# Patient Record
Sex: Female | Born: 1960 | Race: Asian | Hispanic: No | Marital: Married | Smoking: Never smoker
Health system: Southern US, Community
[De-identification: ages and names within clinical notes are randomized; demographics above are authoritative.]

## PROBLEM LIST (undated history)

## (undated) HISTORY — PX: NECK SURGERY: SHX720

---

## 2010-12-28 ENCOUNTER — Emergency Department: Payer: Self-pay | Admitting: Emergency Medicine

## 2011-03-13 ENCOUNTER — Ambulatory Visit: Payer: Self-pay | Admitting: Internal Medicine

## 2011-03-13 LAB — CBC WITH DIFFERENTIAL/PLATELET
Basophil #: 0 10*3/uL (ref 0.0–0.1)
Basophil %: 0.2 %
Eosinophil #: 0 10*3/uL (ref 0.0–0.7)
Eosinophil %: 0 %
HGB: 12.2 g/dL (ref 12.0–16.0)
Lymphocyte %: 14.6 %
MCHC: 33.7 g/dL (ref 32.0–36.0)
Neutrophil %: 74.9 %
Platelet: 215 10*3/uL (ref 150–440)
RBC: 4.02 10*6/uL (ref 3.80–5.20)
WBC: 8.4 10*3/uL (ref 3.6–11.0)

## 2016-06-14 ENCOUNTER — Inpatient Hospital Stay: Payer: BLUE CROSS/BLUE SHIELD | Attending: Oncology | Admitting: Oncology

## 2016-06-14 ENCOUNTER — Encounter: Payer: Self-pay | Admitting: Oncology

## 2016-06-14 ENCOUNTER — Inpatient Hospital Stay: Payer: BLUE CROSS/BLUE SHIELD

## 2016-06-14 ENCOUNTER — Encounter (INDEPENDENT_AMBULATORY_CARE_PROVIDER_SITE_OTHER): Payer: Self-pay

## 2016-06-14 VITALS — BP 121/85 | HR 93 | Temp 98.8°F | Resp 18 | Ht 61.42 in | Wt 105.7 lb

## 2016-06-14 DIAGNOSIS — Z79899 Other long term (current) drug therapy: Secondary | ICD-10-CM | POA: Insufficient documentation

## 2016-06-14 DIAGNOSIS — D709 Neutropenia, unspecified: Secondary | ICD-10-CM

## 2016-06-14 LAB — CBC WITH DIFFERENTIAL/PLATELET
BASOS PCT: 1 %
Basophils Absolute: 0 10*3/uL (ref 0–0.1)
Eosinophils Absolute: 0 10*3/uL (ref 0–0.7)
Eosinophils Relative: 0 %
HEMATOCRIT: 39.3 % (ref 35.0–47.0)
Hemoglobin: 13.4 g/dL (ref 12.0–16.0)
Lymphocytes Relative: 33 %
Lymphs Abs: 1.5 10*3/uL (ref 1.0–3.6)
MCH: 29.9 pg (ref 26.0–34.0)
MCHC: 34.1 g/dL (ref 32.0–36.0)
MCV: 87.7 fL (ref 80.0–100.0)
MONO ABS: 0.4 10*3/uL (ref 0.2–0.9)
MONOS PCT: 9 %
NEUTROS ABS: 2.6 10*3/uL (ref 1.4–6.5)
Neutrophils Relative %: 57 %
Platelets: 251 10*3/uL (ref 150–440)
RBC: 4.48 MIL/uL (ref 3.80–5.20)
RDW: 12.5 % (ref 11.5–14.5)
WBC: 4.5 10*3/uL (ref 3.6–11.0)

## 2016-06-14 LAB — FOLATE: Folate: 28 ng/mL (ref >5.9)

## 2016-06-14 LAB — VITAMIN B12: Vitamin B-12: 491 pg/mL (ref 180–914)

## 2016-06-14 NOTE — Progress Notes (Signed)
Hematology/Oncology Consult note [facility] [phone] [phone]  Patient Care Team: Patient, No Pcp Per as PCP - General (General Practice)   Name of the patient: Pam Contreras  [id number]  09-Jan-1961    Reason for referral- leukopenia   Referring physician- Dr. Netty Starring  Date of visit: 06/14/16   History of presenting illness- Patient is a 55 year old female who was been referred to [place] for evaluation and management of leukopenia. Her most recent CBC from 05/25/2016 revealed a white count of 3.5, H&H of 12.6/37.6 and a platelet count of 220. Differential mainly showed neutral pia with relative lymphocytosis. Absolute neutrophil count was 1.13 and absolute lymphocyte count was normal at 1.87. On reviewing her prior CBCs patient has always had mild neutropenia and her ANC is between 890- 1250  She feels well. Denies any complaints. Denies recurrent infections, unintentional weight loss or night sweats. She has been taking some herbal medication from her home country for several years now. Denies other OTC meds   ECOG PS- 0  Pain scale- 0   Review of systems- Review of Systems  Constitutional: Negative for chills, fever, malaise/fatigue and weight loss.  HENT: Negative for congestion, ear discharge and nosebleeds.   Eyes: Negative for blurred vision.  Respiratory: Negative for cough, hemoptysis, sputum production, shortness of breath and wheezing.   Cardiovascular: Negative for chest pain, palpitations, orthopnea and claudication.  Gastrointestinal: Negative for abdominal pain, blood in stool, constipation, diarrhea, heartburn, melena, nausea and vomiting.  Genitourinary: Negative for dysuria, flank pain, frequency, hematuria and urgency.  Musculoskeletal: Negative for back pain, joint pain and myalgias.  Skin: Negative for rash.  Neurological: Negative for dizziness, tingling, focal weakness, seizures, weakness and headaches.    Endo/Heme/Allergies: Does not bruise/bleed easily.  Psychiatric/Behavioral: Negative for depression and suicidal ideas. The patient does not have insomnia.     No Known Allergies  There are no active problems to display for this patient.    History reviewed. No pertinent past medical history.   History reviewed. No pertinent surgical history.  Social History   Social History  . Marital status: Married    Spouse name: N/A  . Number of children: N/A  . Years of education: N/A   Occupational History  . Not on file.   Social History Main Topics  . Smoking status: Not on file  . Smokeless tobacco: Not on file  . Alcohol use Not on file  . Drug use: Unknown  . Sexual activity: Not on file   Other Topics Concern  . Not on file   Social History Narrative  . No narrative on file     Family History  Problem Relation Age of Onset  . Coronary artery disease Mother   . COPD Father      Current Outpatient Prescriptions:  .  calcium citrate-vitamin D (CITRACAL+D) 315-200 MG-UNIT tablet, Take 1 tablet by mouth daily., Disp: , Rfl:  .  Multiple Vitamin (MULTI-VITAMINS) TABS, Take by mouth., Disp: , Rfl:  .  cyclobenzaprine (FLEXERIL) 10 MG tablet, TAKE 1 TABLET (10 MG TOTAL) BY MOUTH NIGHTLY AS NEEDED FOR MUSCLE SPASMS FOR UP TO 10 DAYS., Disp: , Rfl: 0 .  meloxicam (MOBIC) 15 MG tablet, TAKE 1 TABLET (15 MG TOTAL) BY MOUTH DAILY WITH BREAKFAST., Disp: , Rfl: 0   Physical exam:  Vitals:   06/14/16 1407  BP: 121/85  Pulse: 93  Resp: 18  Temp: 98.8 F (37.1 C)  TempSrc: Tympanic  Weight: 105 lb  11.2 oz (47.9 kg)  Height: 5' 1.42" (1.56 m)   Physical Exam  Constitutional: She is oriented to person, place, and time and well-developed, well-nourished, and in no distress.  HENT:  Head: Normocephalic and atraumatic.  Eyes: EOM are normal. Pupils are equal, round, and reactive to light.  Neck: Normal range of motion.  Cardiovascular: Normal rate, regular rhythm and  normal heart sounds.   Pulmonary/Chest: Effort normal and breath sounds normal.  Abdominal: Soft. Bowel sounds are normal.  Neurological: She is alert and oriented to person, place, and time.  Skin: Skin is warm and dry.       No flowsheet data found. CBC Latest Ref Rng & Units 03/13/2011  WBC 3.6 - 11.0 x10 3/mm 3 8.4  Hemoglobin 12.0 - 16.0 g/dL 12.2  Hematocrit 35.0 - 47.0 % 36.2  Platelets 150 - 440 x10 3/mm 3 215     Assessment and plan- Patient is a 56 y.o. 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 She does not have any other cytopenias. This is likely benign. She is not on medications that can cause neutropenia. Today I will check cbc with diff, pathology review of her smear, B12, folate and copper levels. I will see her back in 2 weeks time to discuss her results. Given that she has mild isolate neutropenia in the absence of other cytopenias, she does not require a bone marrow biopsy at this time  It is unclear what herbal medication she is using. I will have her stop taking that and recheck cbc in 3 months  Thank you for this kind referral and the opportunity to participate in the care of this patient   Visit Diagnosis 1. Neutropenia, unspecified type [facility])     Dr. Randa Evens, MD, MPH [facility] Pager- [phone] 06/14/2016

## 2016-06-14 NOTE — Progress Notes (Signed)
Here for new pt evaluation.  

## 2016-06-15 LAB — PATHOLOGIST SMEAR REVIEW

## 2016-06-27 NOTE — Progress Notes (Signed)
Hematology/Oncology Consult note [facility]  [phone] [phone]  Patient Care Team: Patient, No Pcp Per as PCP - General (General Practice)   Name of the patient: Pam Contreras  [id number]  03/15/1960   Date of visit: 06/27/16  Diagnosis- leukopenia/ neutropenia likley benign versus herbal drug induced  Chief complaint/ Reason for visit- discuss results of bloodwork  Heme/Onc history: Patient is a 56 year old female who was been referred to us for evaluation and management of leukopenia. Her most recent CBC from 05/25/2016 revealed a white count of 3.5, H&H of 12.6/37.6 and a platelet count of 220. Differential mainly showed neutral pia with relative lymphocytosis. Absolute neutrophil count was 1.13 and absolute lymphocyte count was normal at 1.87. On reviewing her prior CBCs patient has always had mild neutropenia and her ANC is between 890- 1250  She feels well. Denies any complaints. Denies recurrent infections, unintentional weight loss or night sweats. She has been taking some herbal medication from her home [place] for several years now. Denies other OTC meds   bloodwork from 06/14/16 was as follows: cbc was normal with wbc of 4.5 with normal differential. Pathology review of smear was unremarkable. b12 and folate was WNL   Interval history- doing well. Denies any complaints   Review of systems- Review of Systems  Constitutional: Negative for chills, fever, malaise/fatigue and weight loss.  HENT: Negative for congestion, ear discharge and nosebleeds.   Eyes: Negative for blurred vision.  Respiratory: Negative for cough, hemoptysis, sputum production, shortness of breath and wheezing.   Cardiovascular: Negative for chest pain, palpitations, orthopnea and claudication.  Gastrointestinal: Negative for abdominal pain, blood in stool, constipation, diarrhea, heartburn, melena, nausea and vomiting.  Genitourinary: Negative  for dysuria, flank pain, frequency, hematuria and urgency.  Musculoskeletal: Negative for back pain, joint pain and myalgias.  Skin: Negative for rash.  Neurological: Negative for dizziness, tingling, focal weakness, seizures, weakness and headaches.  Endo/Heme/Allergies: Does not bruise/bleed easily.  Psychiatric/Behavioral: Negative for depression and suicidal ideas. The patient does not have insomnia.      Current treatment- observation  No Known Allergies   No past medical history on file.   No past surgical history on file.  Social History   Social History  . Marital status: Married    Spouse name: N/A  . Number of children: N/A  . Years of education: N/A   Occupational History  . Not on file.   Social History Main Topics  . Smoking status: Never Smoker  . Smokeless tobacco: Never Used  . Alcohol use No  . Drug use: No  . Sexual activity: No   Other Topics Concern  . Not on file   Social History Narrative  . No narrative on file    Family History  Problem Relation Age of Onset  . Coronary artery disease Mother   . COPD Father      Current Outpatient Prescriptions:  .  calcium citrate-vitamin D (CITRACAL+D) 315-200 MG-UNIT tablet, Take 1 tablet by mouth daily., Disp: , Rfl:  .  cyclobenzaprine (FLEXERIL) 10 MG tablet, TAKE 1 TABLET (10 MG TOTAL) BY MOUTH NIGHTLY AS NEEDED FOR MUSCLE SPASMS FOR UP TO 10 DAYS., Disp: , Rfl: 0 .  meloxicam (MOBIC) 15 MG tablet, TAKE 1 TABLET (15 MG TOTAL) BY MOUTH DAILY WITH BREAKFAST., Disp: , Rfl: 0 .  Multiple Vitamin (MULTI-VITAMINS) TABS, Take by mouth., Disp: , Rfl:   Physical exam:  Vitals:   06/28/16 1127  BP: Marland Kitchen(!)  146/79  Pulse: 94  Resp: 18  Temp: 97.8 F (36.6 C)  TempSrc: Tympanic  Weight: 105 lb 14.4 oz (48 kg)   Physical Exam  Constitutional: She is oriented to person, place, and time and well-developed, well-nourished, and in no distress.  HENT:  Head: Normocephalic and atraumatic.  Eyes: EOM are  normal. Pupils are equal, round, and reactive to light.  Neck: Normal range of motion.  Cardiovascular: Normal rate, regular rhythm and normal heart sounds.   Pulmonary/Chest: Effort normal and breath sounds normal.  Abdominal: Soft. Bowel sounds are normal.  Neurological: She is alert and oriented to person, place, and time.  Skin: Skin is warm and dry.     No flowsheet data found. CBC Latest Ref Rng & Units 06/14/2016  WBC 3.6 - 11.0 K/uL 4.5  Hemoglobin 12.0 - 16.0 g/dL 52.8  Hematocrit 41.3 - 47.0 % 39.3  Platelets 150 - 440 K/uL 251     Assessment and plan- Patient is a 56 y.o. female referred for neutropenia likely benign  I discussed the results of blood work as above. Here repeat cbc was normal with normal wbc and no neutropenia. She has had mild leukopenia in the past and it waxes and wanes. No other cytopenias. This is likely benign. I will see her back in 6 months with repeat cbc and diff. If counts are normal she can continue to f/u with her pcp and can be referred to [place] if she has worsening leukopenia or other cytopenias   Visit Diagnosis 1. Neutropenia, unspecified type [facility])      Dr. Owens Shark, MD, MPH [facility] Pager- [phone] 06/27/2016 11:47 AM

## 2016-06-28 ENCOUNTER — Inpatient Hospital Stay (HOSPITAL_BASED_OUTPATIENT_CLINIC_OR_DEPARTMENT_OTHER): Payer: BLUE CROSS/BLUE SHIELD | Admitting: Oncology

## 2016-06-28 ENCOUNTER — Encounter: Payer: Self-pay | Admitting: Oncology

## 2016-06-28 VITALS — BP 146/79 | HR 94 | Temp 97.8°F | Resp 18 | Wt 105.9 lb

## 2016-06-28 DIAGNOSIS — D709 Neutropenia, unspecified: Secondary | ICD-10-CM

## 2016-06-28 NOTE — Progress Notes (Signed)
Here for follow up.stated she feels"great"

## 2016-07-25 ENCOUNTER — Encounter: Payer: Self-pay | Admitting: Family Medicine

## 2016-08-11 ENCOUNTER — Emergency Department: Payer: BLUE CROSS/BLUE SHIELD

## 2016-08-11 ENCOUNTER — Emergency Department
Admission: EM | Admit: 2016-08-11 | Discharge: 2016-08-11 | Disposition: A | Discharge status: Home / Self Care | Payer: BLUE CROSS/BLUE SHIELD | Attending: Emergency Medicine | Admitting: Emergency Medicine

## 2016-08-11 DIAGNOSIS — R112 Nausea with vomiting, unspecified: Secondary | ICD-10-CM | POA: Insufficient documentation

## 2016-08-11 DIAGNOSIS — R42 Dizziness and giddiness: Secondary | ICD-10-CM | POA: Insufficient documentation

## 2016-08-11 DIAGNOSIS — Z79899 Other long term (current) drug therapy: Secondary | ICD-10-CM | POA: Insufficient documentation

## 2016-08-11 LAB — CBC
HCT: 39.5 % (ref 35.0–47.0)
HEMOGLOBIN: 13.6 g/dL (ref 12.0–16.0)
MCH: 30.4 pg (ref 26.0–34.0)
MCHC: 34.4 g/dL (ref 32.0–36.0)
MCV: 88.3 fL (ref 80.0–100.0)
Platelets: 271 10*3/uL (ref 150–440)
RBC: 4.47 MIL/uL (ref 3.80–5.20)
RDW: 12.9 % (ref 11.5–14.5)
WBC: 6.5 10*3/uL (ref 3.6–11.0)

## 2016-08-11 LAB — TROPONIN I

## 2016-08-11 LAB — COMPREHENSIVE METABOLIC PANEL
ALK PHOS: 73 U/L (ref 38–126)
ALT: 26 U/L (ref 14–54)
ANION GAP: 13 (ref 5–15)
AST: 30 U/L (ref 15–41)
Albumin: 4.8 g/dL (ref 3.5–5.0)
BILIRUBIN TOTAL: 0.7 mg/dL (ref 0.3–1.2)
BUN: 16 mg/dL (ref 6–20)
CALCIUM: 9.9 mg/dL (ref 8.9–10.3)
CO2: 21 mmol/L — AB (ref 22–32)
Chloride: 103 mmol/L (ref 101–111)
Creatinine, Ser: 0.8 mg/dL (ref 0.44–1.00)
GFR calc non Af Amer: >60 mL/min (ref >60)
Glucose, Bld: 165 mg/dL — ABNORMAL HIGH (ref 65–99)
Potassium: 3.3 mmol/L — ABNORMAL LOW (ref 3.5–5.1)
SODIUM: 137 mmol/L (ref 135–145)
TOTAL PROTEIN: 8.3 g/dL — AB (ref 6.5–8.1)

## 2016-08-11 LAB — LIPASE, BLOOD: Lipase: 32 U/L (ref 11–51)

## 2016-08-11 MED ORDER — ONDANSETRON HCL 4 MG/2ML IJ SOLN
INTRAMUSCULAR | Status: AC
  Administered 2016-08-11: 2 mg
  Filled 2016-08-11: qty 2

## 2016-08-11 MED ORDER — ONDANSETRON 4 MG PO TBDP: 4.0000 mg | ORAL_TABLET | Freq: Three times a day (TID) | ORAL | 0 refills | Status: AC | PRN

## 2016-08-11 MED ORDER — SODIUM CHLORIDE 0.9 % IV BOLUS (SEPSIS)
1000.0000 mL | Freq: Once | INTRAVENOUS | Status: AC
  Administered 2016-08-11: 1000 mL via INTRAVENOUS

## 2016-08-11 MED ORDER — GI COCKTAIL ~~LOC~~
30.0000 mL | Freq: Once | ORAL | Status: AC
  Administered 2016-08-11: 30 mL via ORAL
  Filled 2016-08-11: qty 30

## 2016-08-11 NOTE — ED Triage Notes (Signed)
Pt comes via [facility] from work with c/o dizziness and feeling cold. Pt is alert and oriented. Upon arrival to ED pt vomited. Pt states little chest pain and nausea. VS stable, NSR EKG.

## 2016-08-11 NOTE — Discharge Instructions (Signed)
Please follow up with the acute care clinic or your primary care physician

## 2016-08-11 NOTE — ED Provider Notes (Signed)
[facility] Emergency Department Provider Note   ____________________________________________   First MD Initiated Contact with Patient 08/11/16 0002     (approximate)  I have reviewed the triage vital signs and the nursing notes.   HISTORY  Chief Complaint Dizziness and Emesis    HPI Pam Contreras is a 56 y.o. female who comes into the hospital today with dizziness and vomiting. The patient reports that she was at work and she went into the bathroom. She reports that there was a weird smell that made her sick. She started feeling dizzy and felt that she was going to vomit. The patient vomited when she arrived to the emergency department. She reports that she had a few chills when she was at work and took 2 Advil. She states that this was around 9:30. The patient was trying to last until the end of her shift. She reports that after taking the Advil she went to the bathroom and felt nauseous and dizzy. She reports that every time she goes to the bathroom is a strong smell that is like a cleaning product. The patient denies any abdominal pain just felt very dizzy. She reports this lightheaded with the room was not spinning. She reports that her arm felt a little numb and she did develop some chest pain as well on her way here. The patient rates her pain a 4-5 out of 10 in intensity. According to EMS the patient was hyperventilating and she did have some cramping in her hands. The patient reports she has some chest pain and shortness of breath at Center mid chest that just started. The patient came into the hospital today for evaluation. She denies any sick contacts and denies any other complaints at this time.   History reviewed. No pertinent past medical history.  There are no active problems to display for this patient.   History reviewed. No pertinent surgical history.  Prior to Admission medications   Medication Sig Start Date End Date Taking?  Authorizing Provider  Multiple Vitamin (MULTI-VITAMINS) TABS Take by mouth.   Yes [provider]  calcium citrate-vitamin D (CITRACAL+D) 315-200 MG-UNIT tablet Take 1 tablet by mouth daily.    [provider]  ondansetron (ZOFRAN ODT) 4 MG disintegrating tablet Take 1 tablet (4 mg total) by mouth every 8 (eight) hours as needed for nausea or vomiting. 08/11/16   Rebecka Apley, MD    Allergies Patient has no known allergies.  Family History  Problem Relation Age of Onset  . Coronary artery disease Mother   . COPD Father     Social History Social History  Substance Use Topics  . Smoking status: Never Smoker  . Smokeless tobacco: Never Used  . Alcohol use No    Review of Systems  Constitutional: chills Eyes: No visual changes. ENT: No sore throat. Cardiovascular: chest pain. Respiratory:  shortness of breath. Gastrointestinal: No abdominal pain.  No nausea, no vomiting.  No diarrhea.  No constipation. Genitourinary: Negative for dysuria. Musculoskeletal: Negative for back pain. Skin: Negative for rash. Neurological: Dizziness   ____________________________________________   PHYSICAL EXAM:  VITAL SIGNS: ED Triage Vitals  Enc Vitals Group     BP 08/11/16 0012 133/81     Pulse Rate 08/11/16 0012 98     Resp 08/11/16 0012 (!) 22     Temp 08/11/16 0012 (!) 97.5 F (36.4 C)     Temp Source 08/11/16 0012 Oral     SpO2 08/11/16 0012 100 %  Weight 08/11/16 0013 108 lb (49 kg)     Height 08/11/16 0013 5' (1.524 m)     Head Circumference --      Peak Flow --      Pain Score 08/11/16 0011 4     Pain Loc --      Pain Edu? --      Excl. in GC? --     Constitutional: Alert and oriented. Ill appearing and in moderate distress. Eyes: Conjunctivae are normal. PERRL. EOMI. Head: Atraumatic. Nose: No congestion/rhinnorhea. Mouth/Throat: Mucous membranes are moist.  Oropharynx non-erythematous. Cardiovascular: Normal rate, regular rhythm. Grossly  normal heart sounds.  Good peripheral circulation. Respiratory: Normal respiratory effort.  No retractions. Lungs CTAB. Gastrointestinal: Soft and nontender. No distention. Positive bowel sounds Musculoskeletal: No lower extremity tenderness nor edema.   Neurologic:  Normal speech and language. Cranial nerves II through XII are grossly intact with no focal motor neuro deficits. Skin:  Skin is warm, dry and intact.Marland Kitchen. Psychiatric: Mood and affect are normal.   ____________________________________________   LABS (all labs ordered are listed, but only abnormal results are displayed)  Labs Reviewed  COMPREHENSIVE METABOLIC PANEL - Abnormal; Notable for the following:       Result Value   Potassium 3.3 (*)    CO2 21 (*)    Glucose, Bld 165 (*)    Total Protein 8.3 (*)    All other components within normal limits  CBC  LIPASE, BLOOD  TROPONIN I  TROPONIN I   ____________________________________________  EKG  ED ECG REPORT I, Rebecka ApleyWebster,  Avia Merkley P, the attending physician, personally viewed and interpreted this ECG.   Date: 08/11/2016  EKG Time: 0013  Rate: 96  Rhythm: normal sinus rhythm  Axis: normal  Intervals:none  ST&T Change: none  ____________________________________________  RADIOLOGY  Dg Chest 2 View  Result Date: 08/11/2016 CLINICAL DATA:  56 year old female with shortness of breath and dizziness. EXAM: CHEST  2 VIEW COMPARISON:  None. FINDINGS: The lungs are clear. There is no pleural effusion or pneumothorax. A 13 mm nodular density over the left lung base most consistent with nipple shadow. Repeat radiograph with nipple markers may provide better evaluation. The cardiac silhouette is within normal limits with no acute osseous pathology identified. IMPRESSION: No acute cardiopulmonary process. A 13 mm rounded density over the left lung base, likely nipple shadow. Electronically Signed   By: Elgie CollardArash  Radparvar M.D.   On: 08/11/2016 01:08   Ct Head Wo Contrast  Result  Date: 08/11/2016 CLINICAL DATA:  Dizziness and vomiting. EXAM: CT HEAD WITHOUT CONTRAST TECHNIQUE: Contiguous axial images were obtained from the base of the skull through the vertex without intravenous contrast. COMPARISON:  None. FINDINGS: BRAIN: No intraparenchymal hemorrhage, mass effect nor midline shift. The ventricles and sulci are normal. No acute large vascular territory infarcts. No abnormal extra-axial fluid collections. Basal cisterns are patent. VASCULAR: Unremarkable. SKULL/SOFT TISSUES: No skull fracture. No significant soft tissue swelling. ORBITS/SINUSES: The included ocular globes and orbital contents are normal.The mastoid aircells and included paranasal sinuses are well-aerated. OTHER: None. IMPRESSION: Normal noncontrast CT HEAD. Electronically Signed   By: Awilda Metroourtnay  Bloomer M.D.   On: 08/11/2016 01:35    ____________________________________________   PROCEDURES  Procedure(s) performed: None  Procedures  Critical Care performed: No  ____________________________________________   INITIAL IMPRESSION / ASSESSMENT AND PLAN / ED COURSE  Pertinent labs & imaging results that were available during my care of the patient were reviewed by me and considered in my medical decision making (  see chart for details).  This is a 56 year old female who comes into the hospital today with some dizziness, chills and vomiting. The patient vomited when she initially arrived. It appeared to be the food that she had eaten. I did check some blood work to include a troponin 2 as well as a CT scan and a chest x-ray. The patient's blood work is unremarkable and her imaging studies are also unremarkable. I did give the patient some Zofran and some fluids. We attempted a by mouth trial with some water and the patient was able to drink without any difficulty. After some time in the emergency department I checked back on the patient and she reports that she felt improved. I am unsure if the patient is  developing some gastroenteritis given the chills and the vomiting but her workup at this time is unremarkable. The patient's chest pain is also improved. She did have it after she vomited. I will discharge the patient to home to follow-up with her primary care physician. The patient has no further complaints or concerns and no other questions. I instructed the patient to follow-up with her primary care physician for further evaluation or to return with any worsening symptoms or conditions. The patient will be discharged to home.      ____________________________________________   FINAL CLINICAL IMPRESSION(S) / ED DIAGNOSES  Final diagnoses:  Dizziness  Nausea and vomiting, intractability of vomiting not specified, unspecified vomiting type      NEW MEDICATIONS STARTED DURING THIS VISIT:  Discharge Medication List as of 08/11/2016  5:07 AM    START taking these medications   Details  ondansetron (ZOFRAN ODT) 4 MG disintegrating tablet Take 1 tablet (4 mg total) by mouth every 8 (eight) hours as needed for nausea or vomiting., Starting Sat 08/11/2016, Print         Note:  This document was prepared using Dragon voice recognition software and may include unintentional dictation errors.    Rebecka Apley, MD 08/11/16 [phone]

## 2016-08-11 NOTE — ED Notes (Signed)
Pt was given water and able to tolerate

## 2016-08-11 NOTE — ED Notes (Signed)
Patient transported to CT 

## 2016-09-13 ENCOUNTER — Other Ambulatory Visit: Payer: BLUE CROSS/BLUE SHIELD

## 2016-12-28 ENCOUNTER — Inpatient Hospital Stay: Payer: BLUE CROSS/BLUE SHIELD | Admitting: Oncology

## 2016-12-28 ENCOUNTER — Inpatient Hospital Stay: Payer: BLUE CROSS/BLUE SHIELD

## 2019-06-08 IMAGING — CR DG CHEST 2V
1 series · 2 of 2 positions shown · non-contrast
Comparison: None.

CLINICAL DATA: 56-year-old female with shortness of breath and
dizziness.

EXAM:
CHEST  2 VIEW

[Series 1: dg chest 2 view · 0.14mm/px · 2 of 2 slices shown]
[im 1/2]
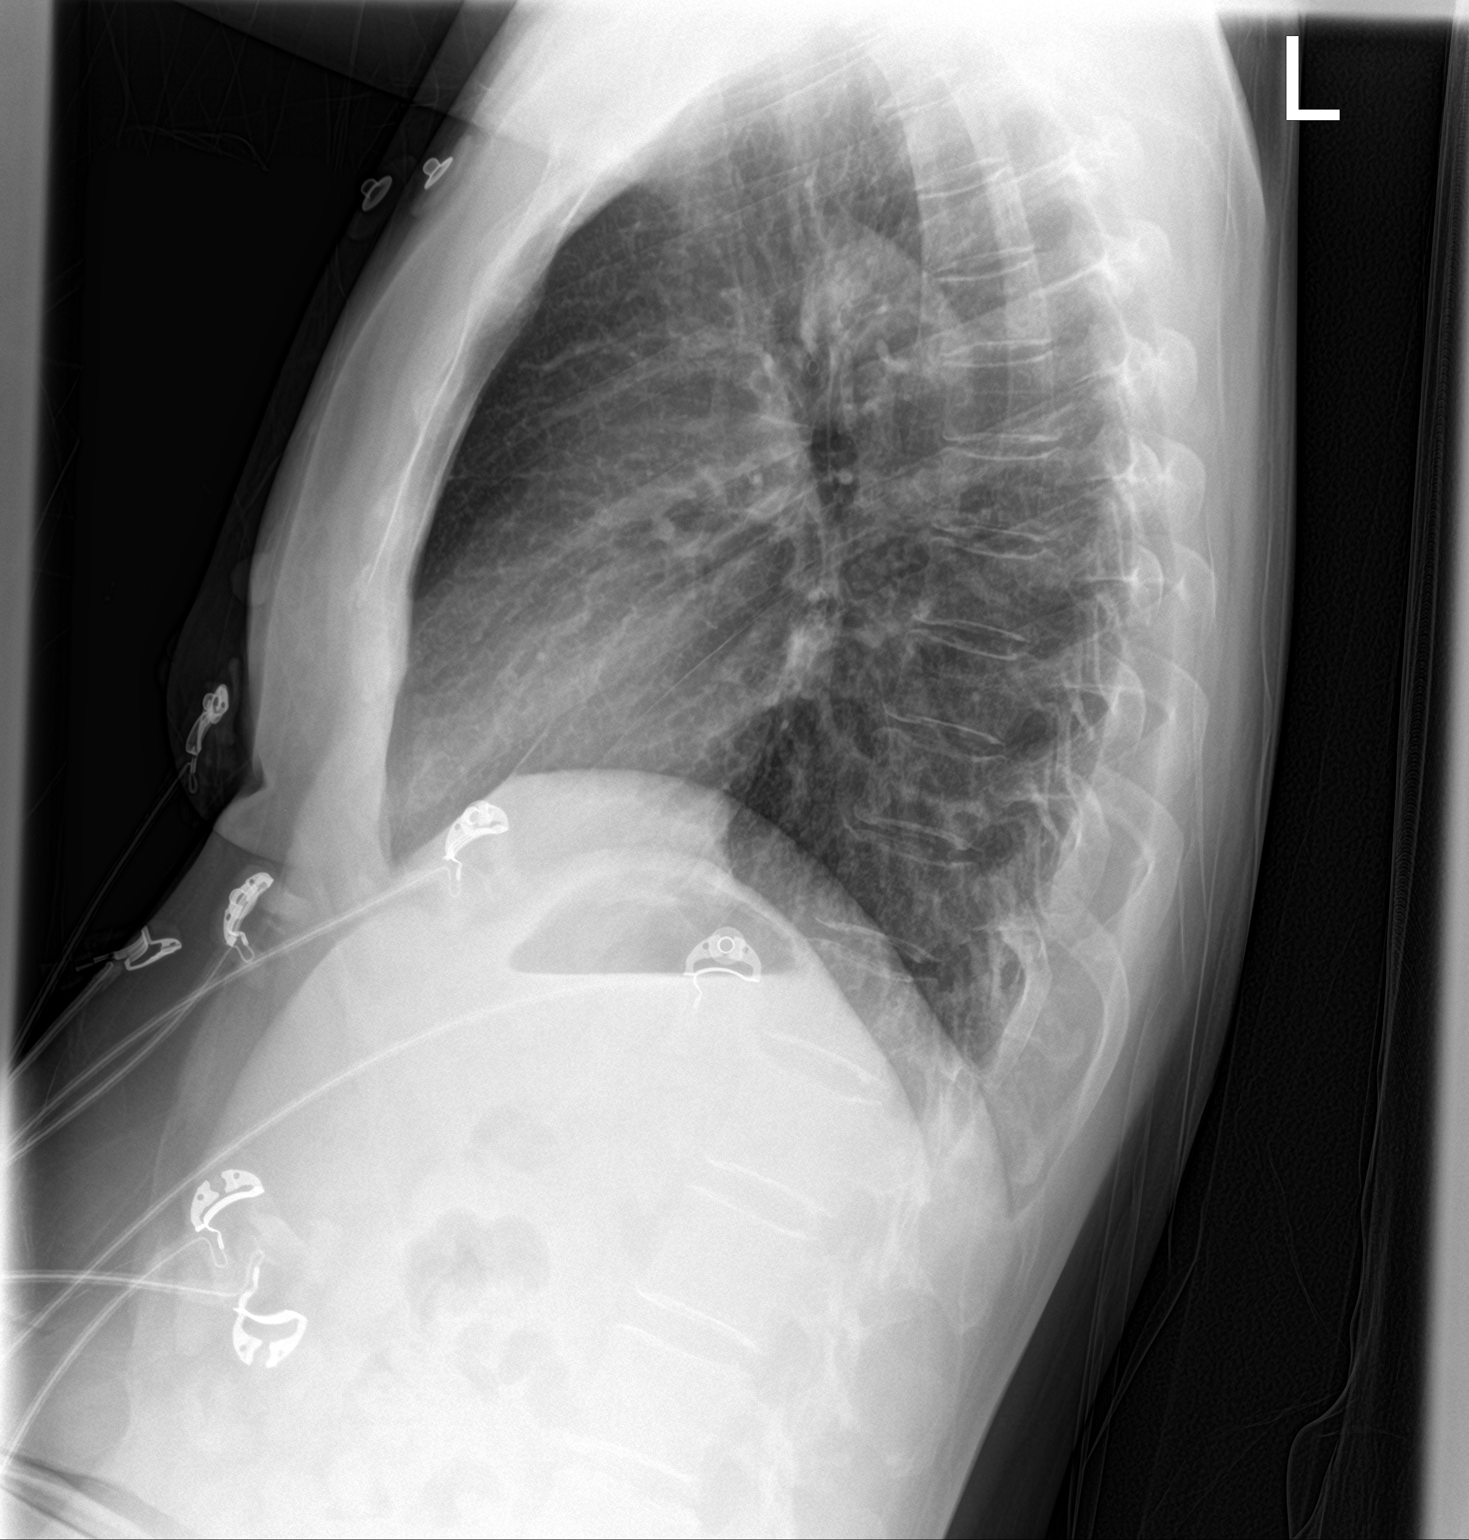
[im 2/2]
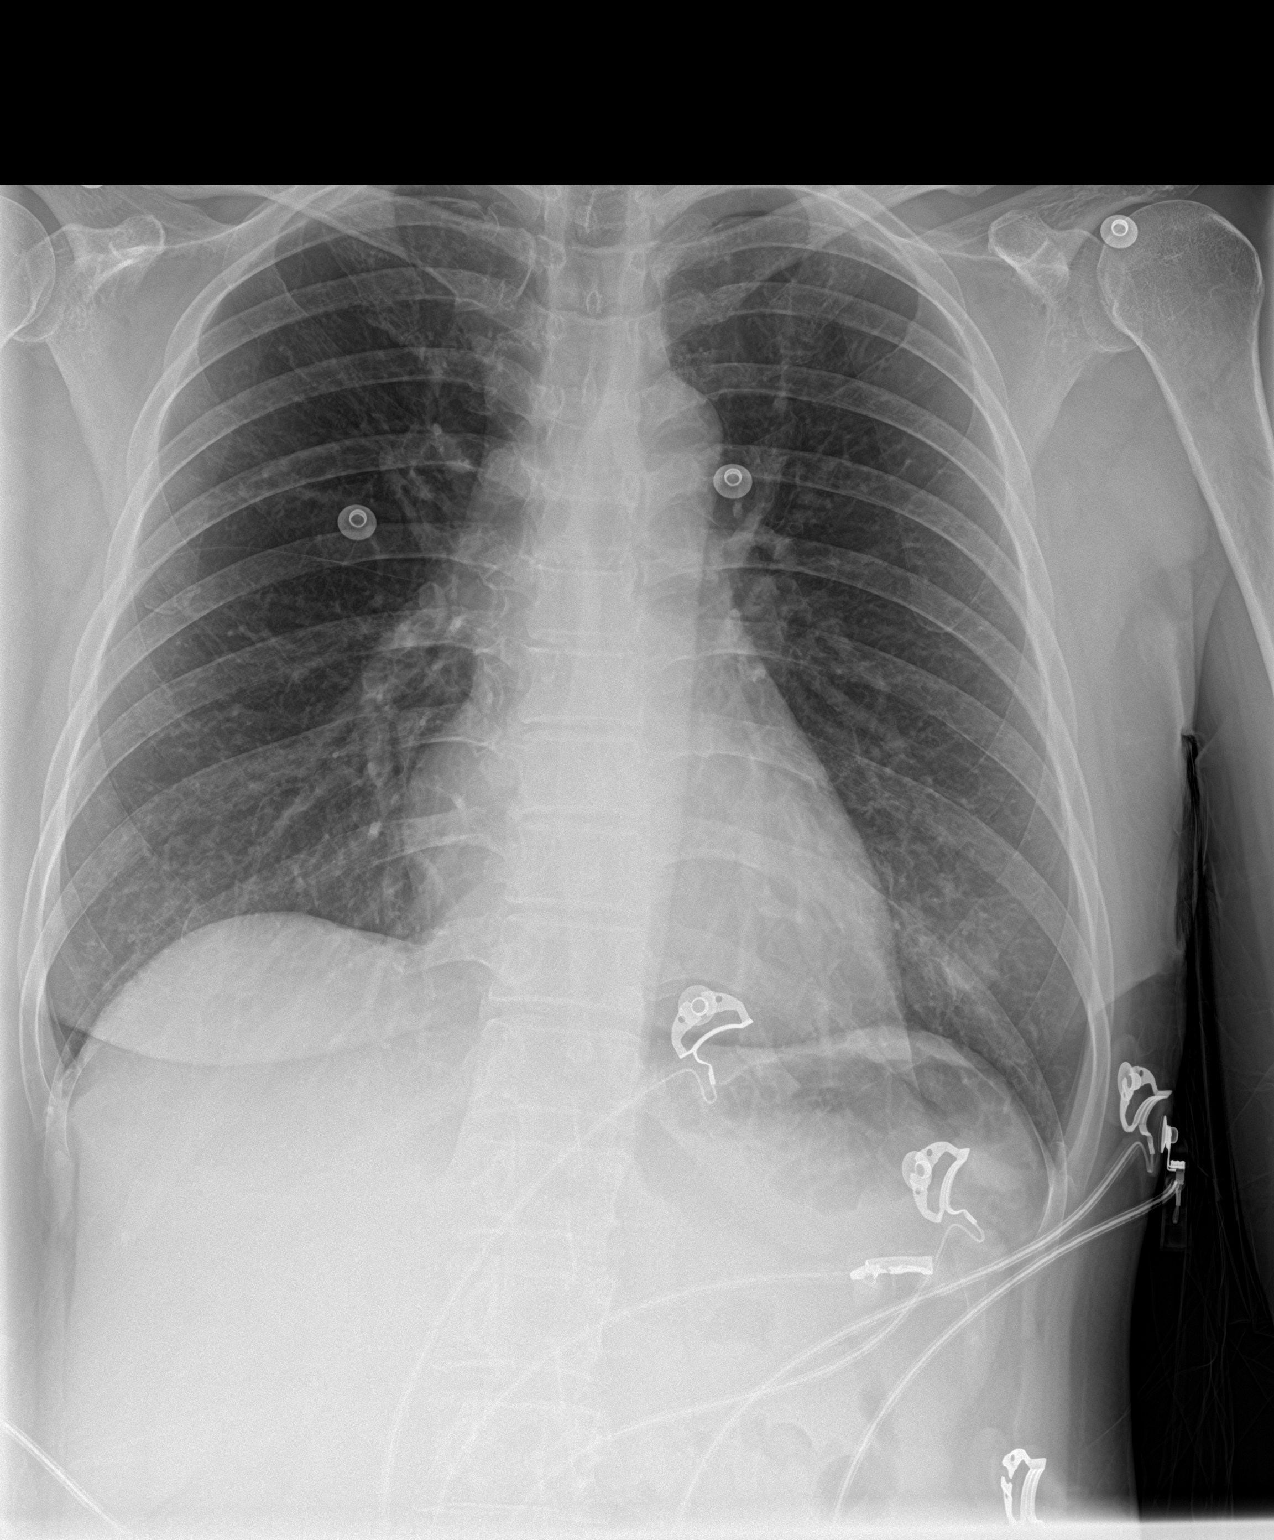

[2 of 2 positions shown; findings below may reference images not displayed]

FINDINGS: The lungs are clear. There is no pleural effusion or pneumothorax. A
13 mm nodular density over the left lung base most consistent with
nipple shadow. Repeat radiograph with nipple markers may provide
better evaluation. The cardiac silhouette is within normal limits
with no acute osseous pathology identified.
IMPRESSION: No acute cardiopulmonary process.

A 13 mm rounded density over the left lung base, likely nipple
shadow.

## 2019-06-08 IMAGING — CT CT HEAD W/O CM
3 series · 15 of 46 positions shown, 18 images · non-contrast
Comparison: None.

CLINICAL DATA: Dizziness and vomiting.

EXAM:
CT HEAD WITHOUT CONTRAST
TECHNIQUE: Contiguous axial images were obtained from the base of the skull
through the vertex without intravenous contrast.

[Series 2: head wo · axial · 0.41mm/px · z∈[-119,+1]mm · 9 of 29 slices shown, 12 images]
[im 3/29  brain]
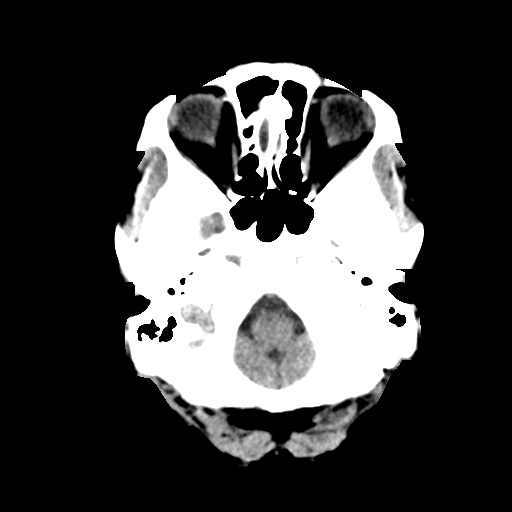
[im 3/29  bone]
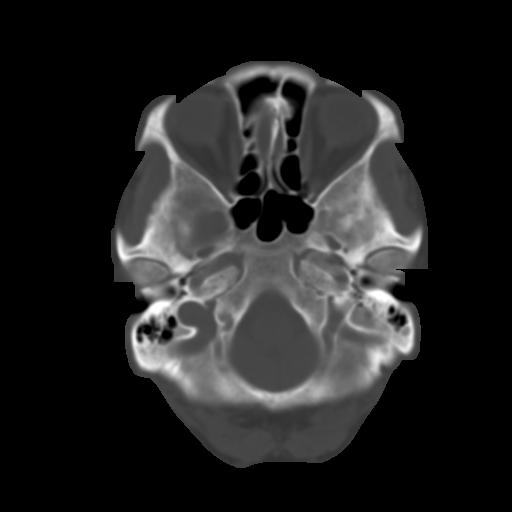
[im 6/29  brain]
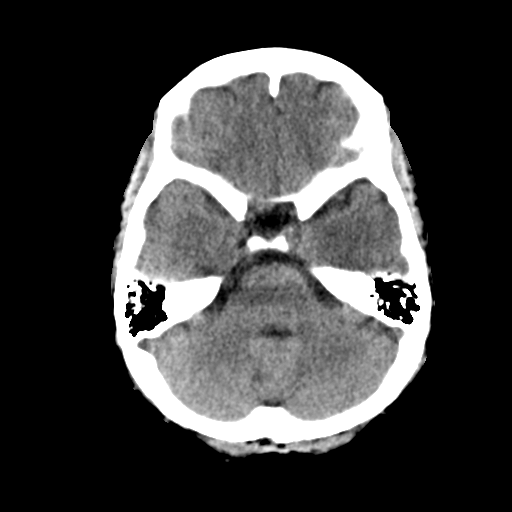
[im 9/29  brain]
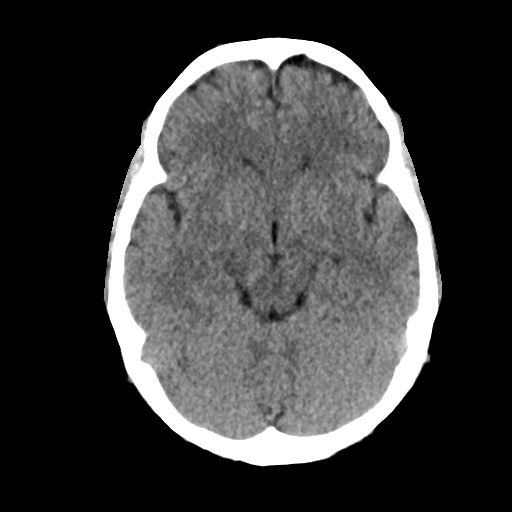
[im 12/29  brain]
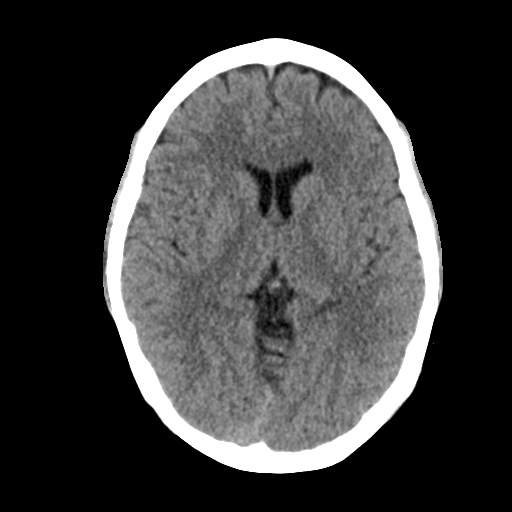
[im 15/29  brain]
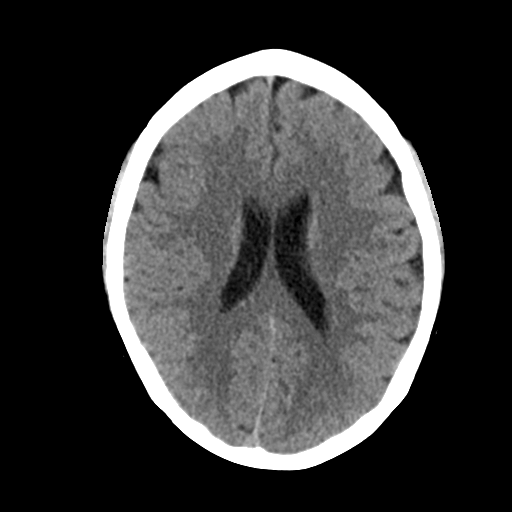
[im 15/29  bone]
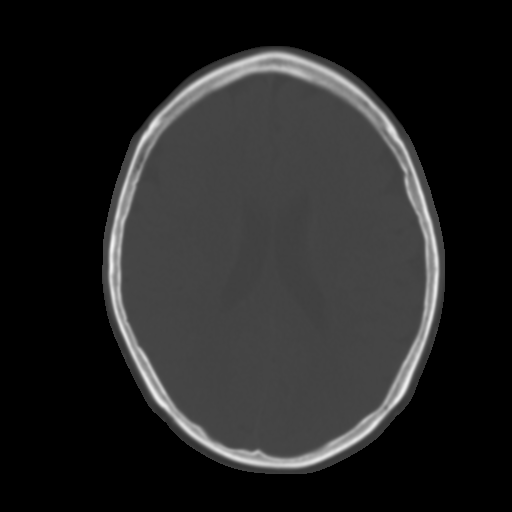
[im 18/29  brain]
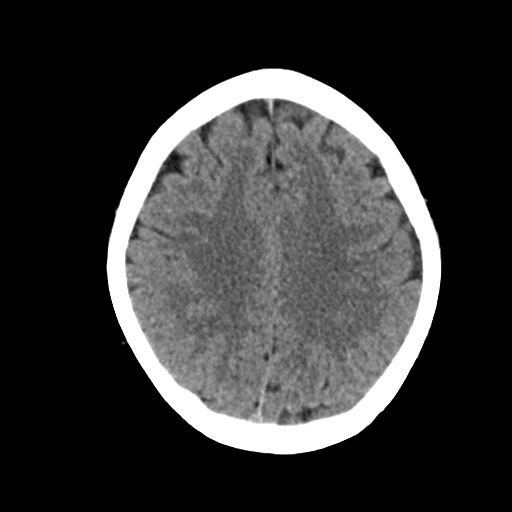
[im 21/29  brain]
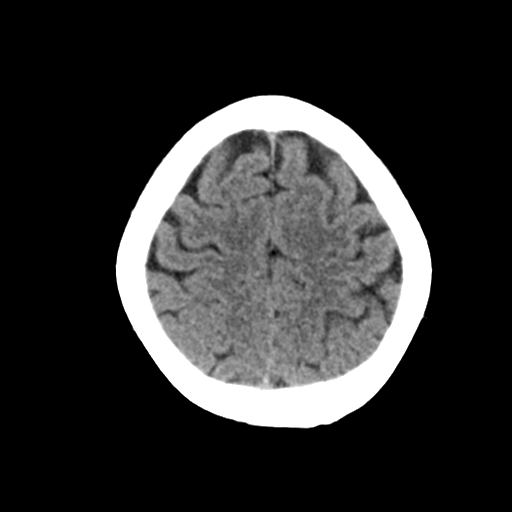
[im 24/29  brain]
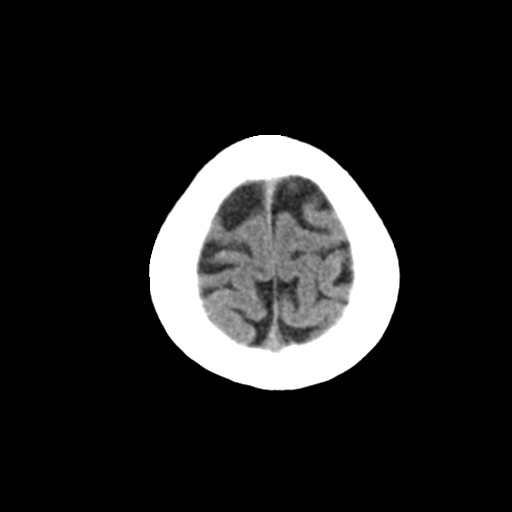
[im 27/29  brain]
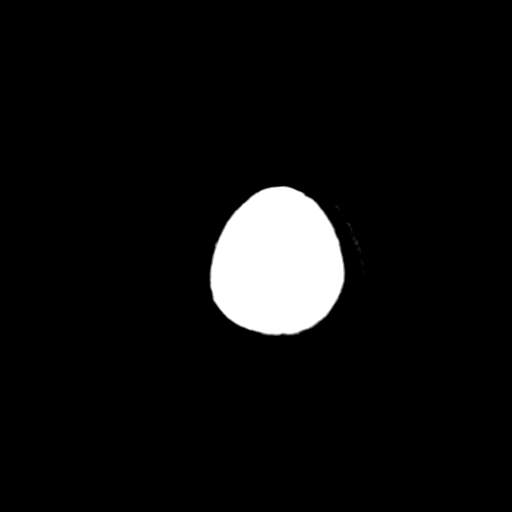
[im 27/29  bone]
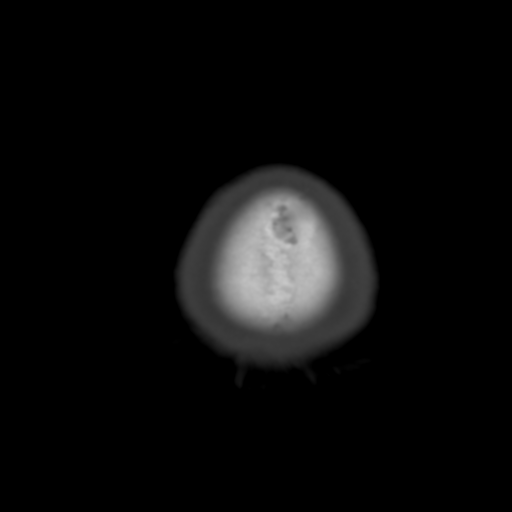

[Series 4: coronal soft tissue · coronal · 0.30mm/px · 3 of 58 slices shown]
[im 20/58  brain]
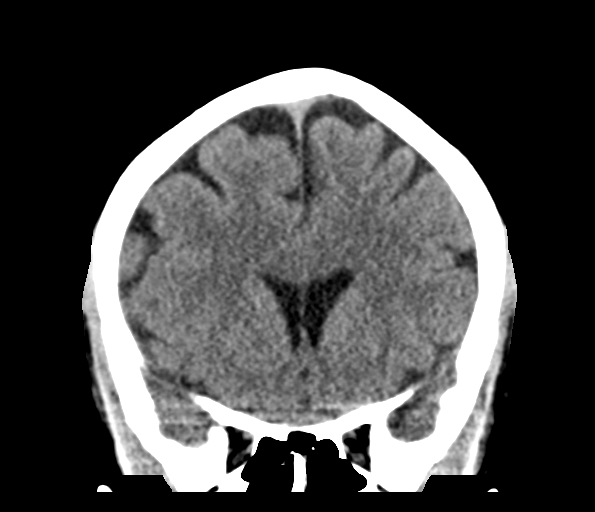
[im 26/58  brain]
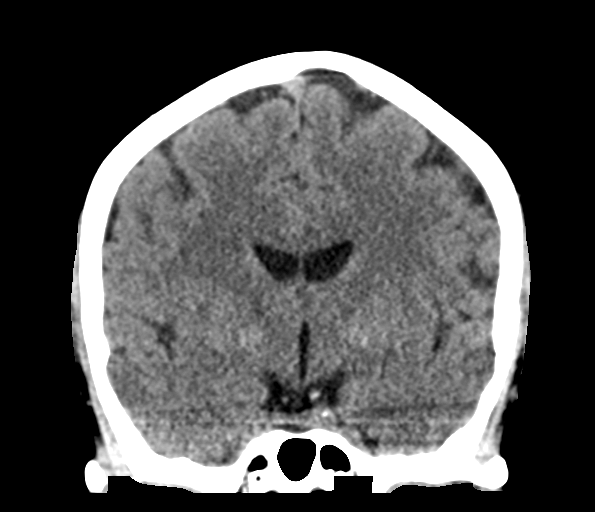
[im 32/58  brain]
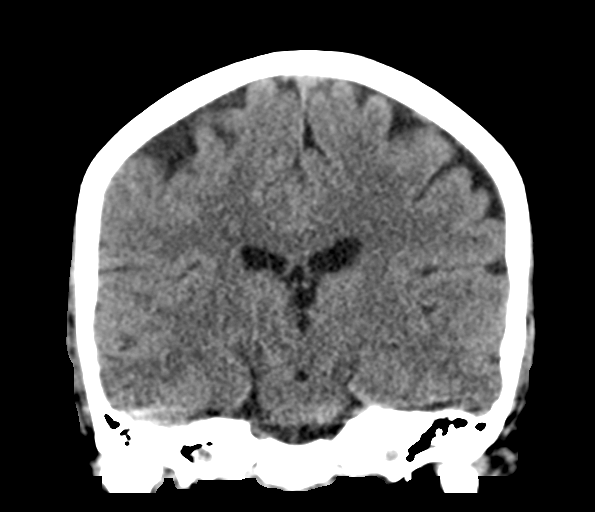

[Series 5: sagittal soft tissue · sagittal · 0.30mm/px · 3 of 50 slices shown]
[im 17/50  brain]
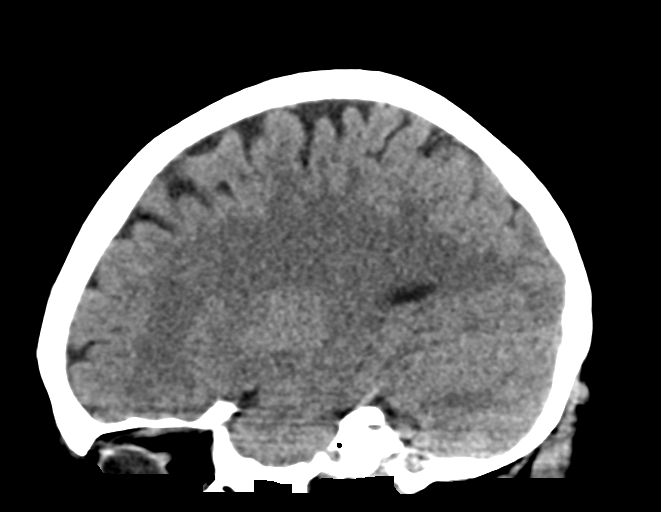
[im 25/50  brain]
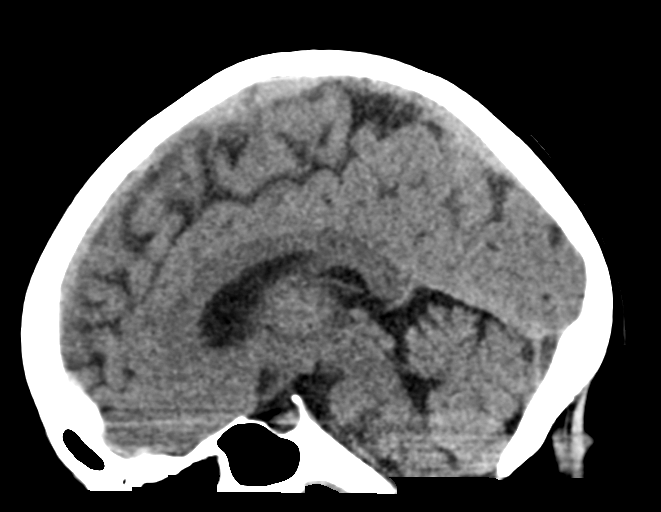
[im 33/50  brain]
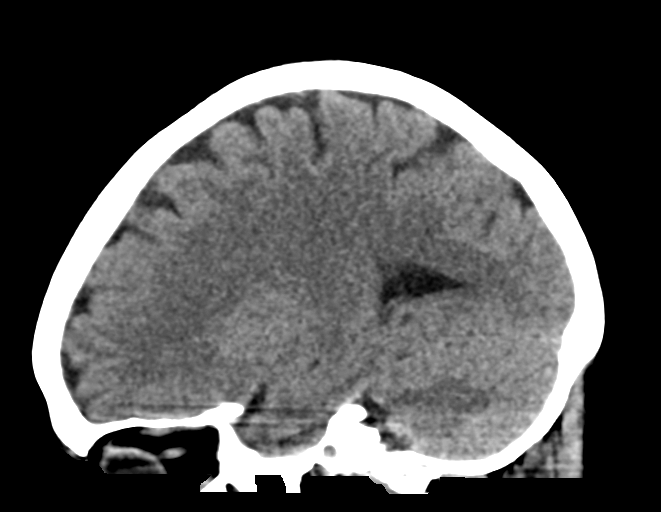

[15 of 46 positions shown; findings below may reference images not displayed]

FINDINGS: BRAIN: No intraparenchymal hemorrhage, mass effect nor midline
shift. The ventricles and sulci are normal. No acute large vascular
territory infarcts. No abnormal extra-axial fluid collections. Basal
cisterns are patent.

VASCULAR: Unremarkable.

SKULL/SOFT TISSUES: No skull fracture. No significant soft tissue
swelling.

ORBITS/SINUSES: The included ocular globes and orbital contents are
normal.The mastoid aircells and included paranasal sinuses are
well-aerated.

OTHER: None.
IMPRESSION: Normal noncontrast CT HEAD.

## 2021-07-31 ENCOUNTER — Telehealth: Payer: 59 | Admitting: Physician Assistant

## 2021-07-31 DIAGNOSIS — R3989 Other symptoms and signs involving the genitourinary system: Secondary | ICD-10-CM

## 2021-07-31 MED ORDER — SULFAMETHOXAZOLE-TRIMETHOPRIM 800-160 MG PO TABS: 1.0000 | ORAL_TABLET | Freq: Two times a day (BID) | ORAL | 0 refills | Status: AC

## 2021-07-31 NOTE — Patient Instructions (Signed)
Arieliz Shelton Silvas, thank you for joining Margaretann Loveless, PA-C for today's virtual visit.  While this provider is not your primary care provider (PCP), if your PCP is located in our provider database this encounter information will be shared with them immediately following your visit.  Consent: (Patient) Pam Contreras provided verbal consent for this virtual visit at the beginning of the encounter.  Current Medications:  Current Outpatient Medications:    sulfamethoxazole-trimethoprim (BACTRIM DS) 800-160 MG tablet, Take 1 tablet by mouth 2 (two) times daily., Disp: 10 tablet, Rfl: 0   calcium citrate-vitamin D (CITRACAL+D) 315-200 MG-UNIT tablet, Take 1 tablet by mouth daily., Disp: , Rfl:    Multiple Vitamin (MULTI-VITAMINS) TABS, Take by mouth., Disp: , Rfl:    ondansetron (ZOFRAN ODT) 4 MG disintegrating tablet, Take 1 tablet (4 mg total) by mouth every 8 (eight) hours as needed for nausea or vomiting., Disp: 20 tablet, Rfl: 0   Medications ordered in this encounter:  Meds ordered this encounter  Medications   sulfamethoxazole-trimethoprim (BACTRIM DS) 800-160 MG tablet    Sig: Take 1 tablet by mouth 2 (two) times daily.    Dispense:  10 tablet    Refill:  0    Order Specific Question:   Supervising Provider    Answer:   Hyacinth Meeker, BRIAN [[id number]]     *If you need refills on other medications prior to your next appointment, please contact your pharmacy*  Follow-Up: Call back or seek an in-person evaluation if the symptoms worsen or if the condition fails to improve as anticipated.  Other Instructions Urinary Tract Infection, Adult A urinary tract infection (UTI) is an infection of any part of the urinary tract. The urinary tract includes: The kidneys. The ureters. The bladder. The urethra. These organs make, store, and get rid of pee (urine) in the body. What are the causes? This infection is caused by germs (bacteria) in your genital area. These germs grow and cause  swelling (inflammation) of your urinary tract. What increases the risk? The following factors may make you more likely to develop this condition: Using a small, thin tube (catheter) to drain pee. Not being able to control when you pee or poop (incontinence). Being female. If you are female, these things can increase the risk: Using these methods to prevent pregnancy: A medicine that kills sperm (spermicide). A device that blocks sperm (diaphragm). Having low levels of a female hormone (estrogen). Being pregnant. You are more likely to develop this condition if: You have genes that add to your risk. You are sexually active. You take antibiotic medicines. You have trouble peeing because of: A prostate that is bigger than normal, if you are female. A blockage in the part of your body that drains pee from the bladder. A kidney stone. A nerve condition that affects your bladder. Not getting enough to drink. Not peeing often enough. You have other conditions, such as: Diabetes. A weak disease-fighting system (immune system). Sickle cell disease. Gout. Injury of the spine. What are the signs or symptoms? Symptoms of this condition include: Needing to pee right away. Peeing small amounts often. Pain or burning when peeing. Blood in the pee. Pee that smells bad or not like normal. Trouble peeing. Pee that is cloudy. Fluid coming from the vagina, if you are female. Pain in the belly or lower back. Other symptoms include: Vomiting. Not feeling hungry. Feeling mixed up (confused). This may be the first symptom in older adults. Being tired and grouchy (irritable). A fever. Watery  poop (diarrhea). How is this treated? Taking antibiotic medicine. Taking other medicines. Drinking enough water. In some cases, you may need to see a specialist. Follow these instructions at home:  Medicines Take over-the-counter and prescription medicines only as told by your doctor. If you were  prescribed an antibiotic medicine, take it as told by your doctor. Do not stop taking it even if you start to feel better. General instructions Make sure you: Pee until your bladder is empty. Do not hold pee for a long time. Empty your bladder after sex. Wipe from front to back after peeing or pooping if you are a female. Use each tissue one time when you wipe. Drink enough fluid to keep your pee pale yellow. Keep all follow-up visits. Contact a doctor if: You do not get better after 1-2 days. Your symptoms go away and then come back. Get help right away if: You have very bad back pain. You have very bad pain in your lower belly. You have a fever. You have chills. You feeling like you will vomit or you vomit. Summary A urinary tract infection (UTI) is an infection of any part of the urinary tract. This condition is caused by germs in your genital area. There are many risk factors for a UTI. Treatment includes antibiotic medicines. Drink enough fluid to keep your pee pale yellow. This information is not intended to replace advice given to you by your health care provider. Make sure you discuss any questions you have with your health care provider. Document Revised: 08/28/2019 Document Reviewed: 08/28/2019 Elsevier Patient Education  2023 Elsevier Inc.    If you have been instructed to have an in-person evaluation today at a local Urgent Care facility, please use the link below. It will take you to a list of all of our available [facility], including address, phone number and hours of operation. Please do not delay care.  [facility]  If you or a family member do not have a primary care provider, use the link below to schedule a visit and establish care. When you choose a [facility] primary care physician or advanced practice provider, you gain a long-term partner in health. Find a Primary Care Provider  Learn more about [facility] in-office and  virtual care options: [facility] - Get Care Now

## 2021-07-31 NOTE — Progress Notes (Signed)
Virtual Visit Consent   Pam Contreras, you are scheduled for a virtual visit with a [facility] provider today. Just as with appointments in the office, your consent must be obtained to participate. Your consent will be active for this visit and any virtual visit you may have with one of our providers in the next 365 days. If you have a MyChart account, a copy of this consent can be sent to you electronically.  As this is a virtual visit, video technology does not allow for your provider to perform a traditional examination. This may limit your provider's ability to fully assess your condition. If your provider identifies any concerns that need to be evaluated in person or the need to arrange testing (such as labs, EKG, etc.), we will make arrangements to do so. Although advances in technology are sophisticated, we cannot ensure that it will always work on either your end or our end. If the connection with a video visit is poor, the visit may have to be switched to a telephone visit. With either a video or telephone visit, we are not always able to ensure that we have a secure connection.  By engaging in this virtual visit, you consent to the provision of healthcare and authorize for your insurance to be billed (if applicable) for the services provided during this visit. Depending on your insurance coverage, you may receive a charge related to this service.  I need to obtain your verbal consent now. Are you willing to proceed with your visit today? Pam Contreras has provided verbal consent on 07/31/2021 for a virtual visit (video or telephone). Margaretann Loveless, PA-C  Date: 07/31/2021 9:49 AM  Virtual Visit via Video Note   I, Margaretann Loveless, connected with  Pam Contreras  ([id number], [id number]) on 07/31/21 at  9:45 AM EDT by a video-enabled telemedicine application and verified that I am speaking with the correct person using two identifiers.  Location: Patient: Virtual Visit  Location Patient: Home Provider: Virtual Visit Location Provider: Home Office   I discussed the limitations of evaluation and management by telemedicine and the availability of in person appointments. The patient expressed understanding and agreed to proceed.    History of Present Illness: Pam Contreras is a 61 y.o. who identifies as a female who was assigned female at birth, and is being seen today for UTI symptoms.  HPI: Urinary Tract Infection  This is a new problem. The current episode started 1 to 4 weeks ago. The problem occurs every urination. The problem has been gradually worsening. The quality of the pain is described as aching and burning. The pain is mild. There has been no fever. Associated symptoms include frequency, hematuria (just today), hesitancy and urgency. Pertinent negatives include no chills, discharge, flank pain, nausea or vomiting. Associated symptoms comments: Low back pain. She has tried increased fluids for the symptoms. The treatment provided no relief. Her past medical history is significant for recurrent UTIs.      Problems: There are no problems to display for this patient.   Allergies: No Known Allergies Medications:  Current Outpatient Medications:    sulfamethoxazole-trimethoprim (BACTRIM DS) 800-160 MG tablet, Take 1 tablet by mouth 2 (two) times daily., Disp: 10 tablet, Rfl: 0   calcium citrate-vitamin D (CITRACAL+D) 315-200 MG-UNIT tablet, Take 1 tablet by mouth daily., Disp: , Rfl:    Multiple Vitamin (MULTI-VITAMINS) TABS, Take by mouth., Disp: , Rfl:    ondansetron (ZOFRAN ODT) 4 MG disintegrating tablet, Take 1 tablet (4  mg total) by mouth every 8 (eight) hours as needed for nausea or vomiting., Disp: 20 tablet, Rfl: 0  Observations/Objective: Patient is well-developed, well-nourished in no acute distress.  Resting comfortably at home.  Head is normocephalic, atraumatic.  No labored breathing.  Speech is clear and coherent with logical  content.  Patient is alert and oriented at baseline.    Assessment and Plan: 1. Suspected UTI - sulfamethoxazole-trimethoprim (BACTRIM DS) 800-160 MG tablet; Take 1 tablet by mouth 2 (two) times daily.  Dispense: 10 tablet; Refill: 0  - Worsening symptoms.  - Will treat empirically with Bactrim - May use AZO for bladder spasms - Continue to push fluids.  - Seek in person evaluation for urine culture if symptoms do not improve or if they worsen.    Follow Up Instructions: I discussed the assessment and treatment plan with the patient. The patient was provided an opportunity to ask questions and all were answered. The patient agreed with the plan and demonstrated an understanding of the instructions.  A copy of instructions were sent to the patient via MyChart unless otherwise noted below.    The patient was advised to call back or seek an in-person evaluation if the symptoms worsen or if the condition fails to improve as anticipated.  Time:  I spent 8 minutes with the patient via telehealth technology discussing the above problems/concerns.    Margaretann Loveless, PA-C

## 2024-01-27 ENCOUNTER — Ambulatory Visit
Admission: RE | Admit: 2024-01-27 | Discharge: 2024-01-27 | Disposition: A | Discharge status: Home / Self Care | Attending: Gastroenterology | Admitting: Gastroenterology

## 2024-01-27 ENCOUNTER — Other Ambulatory Visit: Payer: Self-pay

## 2024-01-27 ENCOUNTER — Ambulatory Visit: Admitting: Registered Nurse

## 2024-01-27 ENCOUNTER — Encounter
Admission: RE | Disposition: A | Discharge status: Home / Self Care | Payer: Self-pay | Source: Home / Self Care | Attending: Gastroenterology

## 2024-01-27 ENCOUNTER — Encounter: Payer: Self-pay | Admitting: Gastroenterology

## 2024-01-27 DIAGNOSIS — Z79899 Other long term (current) drug therapy: Secondary | ICD-10-CM | POA: Insufficient documentation

## 2024-01-27 DIAGNOSIS — D123 Benign neoplasm of transverse colon: Secondary | ICD-10-CM | POA: Diagnosis not present

## 2024-01-27 DIAGNOSIS — Z1211 Encounter for screening for malignant neoplasm of colon: Secondary | ICD-10-CM | POA: Insufficient documentation

## 2024-01-27 DIAGNOSIS — D128 Benign neoplasm of rectum: Secondary | ICD-10-CM | POA: Insufficient documentation

## 2024-01-27 DIAGNOSIS — D120 Benign neoplasm of cecum: Secondary | ICD-10-CM | POA: Insufficient documentation

## 2024-01-27 DIAGNOSIS — D125 Benign neoplasm of sigmoid colon: Secondary | ICD-10-CM | POA: Diagnosis not present

## 2024-01-27 HISTORY — PX: COLONOSCOPY: SHX5424

## 2024-01-27 HISTORY — PX: POLYPECTOMY: SHX149

## 2024-01-27 SURGERY — COLONOSCOPY
Anesthesia: General

## 2024-01-27 MED ORDER — PROPOFOL 1000 MG/100ML IV EMUL
INTRAVENOUS | Status: AC
  Filled 2024-01-27: qty 100

## 2024-01-27 MED ORDER — SODIUM CHLORIDE 0.9 % IV SOLN: INTRAVENOUS | Status: DC

## 2024-01-27 MED ORDER — PROPOFOL 500 MG/50ML IV EMUL
INTRAVENOUS | Status: DC | PRN
  Administered 2024-01-27: 150 ug/kg/min via INTRAVENOUS

## 2024-01-27 MED ORDER — PHENYLEPHRINE 80 MCG/ML (10ML) SYRINGE FOR IV PUSH (FOR BLOOD PRESSURE SUPPORT)
PREFILLED_SYRINGE | INTRAVENOUS | Status: DC | PRN
  Administered 2024-01-27 (×3): 80 ug via INTRAVENOUS

## 2024-01-27 MED ORDER — DEXMEDETOMIDINE HCL IN NACL 80 MCG/20ML IV SOLN
INTRAVENOUS | Status: DC | PRN
  Administered 2024-01-27: 8 ug via INTRAVENOUS

## 2024-01-27 MED ORDER — PROPOFOL 10 MG/ML IV BOLUS
INTRAVENOUS | Status: DC | PRN
  Administered 2024-01-27: 80 mg via INTRAVENOUS

## 2024-01-27 MED ORDER — LIDOCAINE HCL (CARDIAC) PF 100 MG/5ML IV SOSY
PREFILLED_SYRINGE | INTRAVENOUS | Status: DC | PRN
  Administered 2024-01-27: 40 mg via INTRAVENOUS

## 2024-01-27 NOTE — Transfer of Care (Signed)
 Immediate Anesthesia Transfer of Care Note  Patient: Pam Contreras  Procedure(s) Performed: Procedures: COLONOSCOPY (N/A) POLYPECTOMY, INTESTINE  Patient Location: PACU and Endoscopy Unit  Anesthesia Type:General  Level of Consciousness: sedated  Airway & Oxygen Therapy: Patient Spontanous Breathing and Patient connected to nasal cannula oxygen  Post-op Assessment: Report given to RN and Post -op Vital signs reviewed and stable  Post vital signs: Reviewed and stable  Last Vitals:  Vitals:   01/27/24 0807 01/27/24 0854  BP: (!) 149/82 (!) 81/40  Pulse: (!) 110 68  Resp: 18 17  Temp: (!) 36.1 C (!) 35.7 C  SpO2: 100% 100%    Complications: No apparent anesthesia complications

## 2024-01-27 NOTE — Anesthesia Procedure Notes (Signed)
 Date/Time: 01/27/2024 8:36 AM  Performed by: Tod Handing, CRNAPre-anesthesia Checklist: Patient identified, Emergency Drugs available, Suction available and Patient being monitored Patient Re-evaluated:Patient Re-evaluated prior to induction Oxygen Delivery Method: Nasal cannula Induction Type: IV induction Dental Injury: Teeth and Oropharynx as per pre-operative assessment  Comments: Nasal cannula with etCO2 monitoring

## 2024-01-27 NOTE — Op Note (Signed)
 [facility] Gastroenterology Patient Name: Pam Contreras Procedure Date: 01/27/2024 8:22 AM MRN: [id number] Account #: [id number] Date of Birth: 1960-03-14 Admit Type: Outpatient Age: 63 Room: [facility] ENDO ROOM 1 Gender: Female Note Status: Finalized Instrument Name: Colon Scope [id number] Procedure:             Colonoscopy Indications:           Surveillance: Personal history of adenomatous polyps                         on last colonoscopy > 5 years ago Providers:             Ruel Kung MD, MD Medicines:             Monitored Anesthesia Care Complications:         No immediate complications. Procedure:             Pre-Anesthesia Assessment:                        - Prior to the procedure, a History and Physical was                         performed, and patient medications, allergies and                         sensitivities were reviewed. The patient's tolerance                         of previous anesthesia was reviewed.                        - The risks and benefits of the procedure and the                         sedation options and risks were discussed with the                         patient. All questions were answered and informed                         consent was obtained.                        - ASA Grade Assessment: II - A patient with mild                         systemic disease.                        After obtaining informed consent, the colonoscope was                         passed under direct vision. Throughout the procedure,                         the patient's blood pressure, pulse, and oxygen                         saturations were monitored continuously. The  Colonoscope was introduced through the anus and                         advanced to the the cecum, identified by the                         appendiceal orifice. The colonoscopy was performed                         with ease. The patient tolerated the  procedure well.                         The quality of the bowel preparation was excellent.                         The ileocecal valve, appendiceal orifice, and rectum                         were photographed. Findings:      The perianal and digital rectal examinations were normal.      Three sessile polyps were found in the rectum, sigmoid colon and       transverse colon. The polyps were 4 to 6 mm in size. These polyps were       removed with a cold snare. Resection and retrieval were complete.      Two sessile polyps were found in the cecum. The polyps were 4 to 5 mm in       size. These polyps were removed with a cold snare. Resection and       retrieval were complete.      The exam was otherwise without abnormality on direct and retroflexion       views. Impression:            - Three 4 to 6 mm polyps in the rectum, in the sigmoid                         colon and in the transverse colon, removed with a cold                         snare. Resected and retrieved.                        - Two 4 to 5 mm polyps in the cecum, removed with a                         cold snare. Resected and retrieved.                        - The examination was otherwise normal on direct and                         retroflexion views. Recommendation:        - Discharge patient to home (with escort).                        - Resume previous diet.                        -  Continue present medications.                        - Await pathology results.                        - Repeat colonoscopy in 3 years for surveillance. Procedure Code(s):     --- Professional ---                        [phone], Colonoscopy, flexible; with removal of                         tumor(s), polyp(s), or other lesion(s) by snare                         technique Diagnosis Code(s):     --- Professional ---                        Z86.010, Personal history of colonic polyps                        D12.8, Benign neoplasm of rectum                         D12.5, Benign neoplasm of sigmoid colon                        D12.3, Benign neoplasm of transverse colon (hepatic                         flexure or splenic flexure)                        D12.0, Benign neoplasm of cecum CPT copyright 2022 American Medical Association. All rights reserved. The codes documented in this report are preliminary and upon coder review may  be revised to meet current compliance requirements. Ruel Kung, MD Ruel Kung MD, MD 01/27/2024 8:53:14 AM This report has been signed electronically. Number of Addenda: 0 Note Initiated On: 01/27/2024 8:22 AM Scope Withdrawal Time: 0 hours 9 minutes 24 seconds  Total Procedure Duration: 0 hours 12 minutes 24 seconds  Estimated Blood Loss:  Estimated blood loss: none.      [facility]

## 2024-01-27 NOTE — Anesthesia Preprocedure Evaluation (Signed)
"                                    Anesthesia Evaluation  Patient identified by MRN, date of birth, ID band Patient awake    Reviewed: Allergy & Precautions, H&P , NPO status , Patient's Chart, lab work & pertinent test results, reviewed documented beta blocker date and time   History of Anesthesia Complications Negative for: history of anesthetic complications  Airway Mallampati: II  TM Distance: >3 FB Neck ROM: full    Dental  (+) Dental Advidsory Given   Pulmonary neg pulmonary ROS   Pulmonary exam normal breath sounds clear to auscultation       Cardiovascular Exercise Tolerance: Good negative cardio ROS Normal cardiovascular exam Rhythm:regular Rate:Normal     Neuro/Psych negative neurological ROS  negative psych ROS   GI/Hepatic negative GI ROS, Neg liver ROS,,,  Endo/Other  negative endocrine ROS    Renal/GU negative Renal ROS  negative genitourinary   Musculoskeletal   Abdominal   Peds  Hematology negative hematology ROS (+)   Anesthesia Other Findings History reviewed. No pertinent past medical history.   Reproductive/Obstetrics negative OB ROS                              Anesthesia Physical Anesthesia Plan  ASA: 1  Anesthesia Plan: General   Post-op Pain Management:    Induction: Intravenous  PONV Risk Score and Plan: 3 and Propofol  infusion and TIVA  Airway Management Planned: Natural Airway and Nasal Cannula  Additional Equipment:   Intra-op Plan:   Post-operative Plan:   Informed Consent: I have reviewed the patients History and Physical, chart, labs and discussed the procedure including the risks, benefits and alternatives for the proposed anesthesia with the patient or authorized representative who has indicated his/her understanding and acceptance.     Dental Advisory Given  Plan Discussed with: Anesthesiologist, CRNA and Surgeon  Anesthesia Plan Comments:         Anesthesia  Quick Evaluation  "

## 2024-01-27 NOTE — H&P (Signed)
 "  Ruel Kung , MD [address], [place], [place], [address] Phone: [phone] Fax: [phone]  Primary Care Physician:  Sherial Bail, MD   Pre-Procedure History & Physical: HPI:  Chanice Brenton is a 63 y.o. female is here for an colonoscopy.   History reviewed. No pertinent past medical history.  Past Surgical History:  Procedure Laterality Date   NECK SURGERY      Prior to Admission medications  Medication Sig Start Date End Date Taking? Authorizing Provider  gabapentin (NEURONTIN) 400 MG capsule Take 400 mg by mouth 3 (three) times daily.   Yes [provider]  pregabalin (LYRICA) 100 MG capsule Take 100 mg by mouth daily.   Yes [provider]  calcium citrate-vitamin D (CITRACAL+D) 315-200 MG-UNIT tablet Take 1 tablet by mouth daily.    [provider]  Multiple Vitamin (MULTI-VITAMINS) TABS Take by mouth.    [provider]  ondansetron  (ZOFRAN  ODT) 4 MG disintegrating tablet Take 1 tablet (4 mg total) by mouth every 8 (eight) hours as needed for nausea or vomiting. 08/11/16   Charlann Isaiah SQUIBB, MD  sulfamethoxazole -trimethoprim  (BACTRIM  DS) 800-160 MG tablet Take 1 tablet by mouth 2 (two) times daily. 07/31/21   Vivienne Delon HERO, PA-C    Allergies as of 01/09/2024   (No Known Allergies)    Family History  Problem Relation Age of Onset   Coronary artery disease Mother    COPD Father     Social History   Socioeconomic History   Marital status: Married    Spouse name: Not on file   Number of children: Not on file   Years of education: Not on file   Highest education level: Not on file  Occupational History   Not on file  Tobacco Use   Smoking status: Never   Smokeless tobacco: Never  Vaping Use   Vaping status: Never Used  Substance and Sexual Activity   Alcohol use: No   Drug use: No   Sexual activity: Never  Other Topics Concern   Not on file  Social History Narrative   Not on file    Social Drivers of Health   Tobacco Use: Low Risk (01/27/2024)   Patient History    Smoking Tobacco Use: Never    Smokeless Tobacco Use: Never    Passive Exposure: Not on file  Financial Resource Strain: Low Risk  (11/04/2023)   Received [facility]   Overall Financial Resource Strain (CARDIA)    Difficulty of Paying Living Expenses: Not hard at all  Food Insecurity: No Food Insecurity (11/04/2023)   Received from [facility]   Epic    Within the past 12 months, you worried that your food would run out before you got the money to buy more.: Never true    Within the past 12 months, the food you bought just didn't last and you didn't have money to get more.: Never true  Transportation Needs: No Transportation Needs (11/04/2023)   Received from [facility] - Transportation    In the past 12 months, has lack of transportation kept you from medical appointments or from getting medications?: No    Lack of Transportation (Non-Medical): No  Physical Activity: Not on file  Stress: Not on file  Social Connections: Not on file  Intimate Partner Violence: Not on file  Depression (EYV7-0): Not on file  Alcohol Screen: Not on file  Housing: Low Risk  (  11/04/2023)   Received from [facility]   Epic    In the last 12 months, was there a time when you were not able to pay the mortgage or rent on time?: No    In the past 12 months, how many times have you moved where you were living?: 0    At any time in the past 12 months, were you homeless or living in a shelter (including now)?: No  Utilities: Not At Risk (11/04/2023)   Received from [facility]   Epic    In the past 12 months has the electric, gas, oil, or water company threatened to shut off services in your home?: No  Health Literacy: Not on file    Review of Systems: See HPI, otherwise negative ROS  Physical Exam: BP (!) 149/82    Pulse (!) 110   Temp (!) 97 F (36.1 C) (Temporal)   Resp 18   Ht 5' (1.524 m)   Wt 48.8 kg   SpO2 100%   BMI 21.01 kg/m  General:   Alert,  pleasant and cooperative in NAD Head:  Normocephalic and atraumatic. Neck:  Supple; no masses or thyromegaly. Lungs:  Clear throughout to auscultation, normal respiratory effort.    Heart:  +S1, +S2, Regular rate and rhythm, No edema. Abdomen:  Soft, nontender and nondistended. Normal bowel sounds, without guarding, and without rebound.   Neurologic:  Alert and  oriented x4;  grossly normal neurologically.  Impression/Plan: Regnia Mathwig is here for an colonoscopy to be performed for surveillance due to prior history of colon polyps   Risks, benefits, limitations, and alternatives regarding  colonoscopy have been reviewed with the patient.  Questions have been answered.  All parties agreeable.   Ruel Kung, MD  01/27/2024, 8:11 AM  "

## 2024-01-28 ENCOUNTER — Encounter: Payer: Self-pay | Admitting: Gastroenterology

## 2024-01-28 LAB — SURGICAL PATHOLOGY

## 2024-01-28 NOTE — Anesthesia Postprocedure Evaluation (Signed)
"   Anesthesia Post Note  Patient: Chief Executive Officer  Procedure(s) Performed: COLONOSCOPY POLYPECTOMY, INTESTINE  Patient location during evaluation: Endoscopy Anesthesia Type: General Level of consciousness: awake and alert Pain management: pain level controlled Vital Signs Assessment: post-procedure vital signs reviewed and stable Respiratory status: spontaneous breathing, nonlabored ventilation, respiratory function stable and patient connected to nasal cannula oxygen Cardiovascular status: blood pressure returned to baseline and stable Postop Assessment: no apparent nausea or vomiting Anesthetic complications: no   No notable events documented.   Last Vitals:  Vitals:   01/27/24 0903 01/27/24 0911  BP: (!) 80/50 (!) 89/60  Pulse: 72 66  Resp: 14 16  Temp:    SpO2: 100% 97%    Last Pain:  Vitals:   01/28/24 0750  TempSrc:   PainSc: 0-No pain                 Prentice Murphy      "

## 2024-01-31 ENCOUNTER — Ambulatory Visit: Payer: Self-pay | Admitting: Gastroenterology
